# Patient Record
Sex: Male | Born: 1980 | Race: Black or African American | Hispanic: No | Marital: Single | Smoking: Never smoker
Health system: Southern US, Community
[De-identification: ages and names within clinical notes are randomized; demographics above are authoritative.]

## PROBLEM LIST (undated history)

## (undated) DIAGNOSIS — H9190 Unspecified hearing loss, unspecified ear: Secondary | ICD-10-CM

## (undated) DIAGNOSIS — Q670 Congenital facial asymmetry: Secondary | ICD-10-CM

## (undated) DIAGNOSIS — T7840XA Allergy, unspecified, initial encounter: Secondary | ICD-10-CM

## (undated) DIAGNOSIS — J330 Polyp of nasal cavity: Secondary | ICD-10-CM

## (undated) HISTORY — DX: Allergy, unspecified, initial encounter: T78.40XA

## (undated) HISTORY — PX: PALATE SURGERY: SHX729

## (undated) HISTORY — PX: GASTROSTOMY: SHX151

## (undated) HISTORY — PX: OTHER SURGICAL HISTORY: SHX169

## (undated) HISTORY — PX: EXTERNAL EAR SURGERY: SHX627

## (undated) HISTORY — DX: Congenital facial asymmetry: Q67.0

## (undated) HISTORY — DX: Unspecified hearing loss, unspecified ear: H91.90

## (undated) HISTORY — DX: Polyp of nasal cavity: J33.0

---

## 2003-03-25 ENCOUNTER — Encounter: Admission: RE | Admit: 2003-03-25 | Discharge: 2003-03-25 | Payer: Self-pay | Admitting: Family Medicine

## 2004-07-18 ENCOUNTER — Ambulatory Visit: Payer: Self-pay | Admitting: Family Medicine

## 2005-06-02 ENCOUNTER — Ambulatory Visit: Payer: Self-pay | Admitting: Family Medicine

## 2005-12-18 ENCOUNTER — Ambulatory Visit: Payer: Self-pay | Admitting: Sports Medicine

## 2006-01-01 ENCOUNTER — Ambulatory Visit: Payer: Self-pay | Admitting: Family Medicine

## 2006-09-27 DIAGNOSIS — H9190 Unspecified hearing loss, unspecified ear: Secondary | ICD-10-CM | POA: Insufficient documentation

## 2007-01-21 ENCOUNTER — Telehealth: Payer: Self-pay | Admitting: *Deleted

## 2007-01-21 ENCOUNTER — Encounter (INDEPENDENT_AMBULATORY_CARE_PROVIDER_SITE_OTHER): Payer: Self-pay | Admitting: Family Medicine

## 2007-01-21 ENCOUNTER — Ambulatory Visit: Payer: Self-pay | Admitting: Family Medicine

## 2007-01-21 DIAGNOSIS — H66009 Acute suppurative otitis media without spontaneous rupture of ear drum, unspecified ear: Secondary | ICD-10-CM | POA: Insufficient documentation

## 2007-02-26 ENCOUNTER — Encounter (INDEPENDENT_AMBULATORY_CARE_PROVIDER_SITE_OTHER): Payer: Self-pay | Admitting: *Deleted

## 2010-03-22 ENCOUNTER — Ambulatory Visit: Payer: Self-pay | Admitting: Family Medicine

## 2010-03-22 ENCOUNTER — Encounter: Payer: Self-pay | Admitting: Family Medicine

## 2010-03-22 DIAGNOSIS — R112 Nausea with vomiting, unspecified: Secondary | ICD-10-CM | POA: Insufficient documentation

## 2010-03-22 DIAGNOSIS — H0019 Chalazion unspecified eye, unspecified eyelid: Secondary | ICD-10-CM | POA: Insufficient documentation

## 2010-03-22 LAB — CONVERTED CEMR LAB
BUN: 10 mg/dL (ref 6–23)
Calcium: 8.7 mg/dL (ref 8.4–10.5)
Chloride: 101 meq/L (ref 96–112)
Creatinine, Ser: 0.93 mg/dL (ref 0.40–1.50)

## 2010-03-23 ENCOUNTER — Telehealth: Payer: Self-pay | Admitting: *Deleted

## 2010-05-12 ENCOUNTER — Ambulatory Visit: Payer: Self-pay | Admitting: Family Medicine

## 2010-05-12 DIAGNOSIS — H7292 Unspecified perforation of tympanic membrane, left ear: Secondary | ICD-10-CM | POA: Insufficient documentation

## 2010-07-31 HISTORY — PX: TYMPANOSTOMY: SHX2586

## 2010-08-11 ENCOUNTER — Encounter: Payer: Self-pay | Admitting: Family Medicine

## 2010-08-12 ENCOUNTER — Encounter: Payer: Self-pay | Admitting: Family Medicine

## 2010-08-30 NOTE — Assessment & Plan Note (Signed)
Summary: ear ache/virus,df   Vital Signs:  Patient profile:   30 year old male Height:      66.5 inches Weight:      144 pounds BMI:     22.98 Temp:     98.0 degrees F oral Pulse rate:   74 / minute BP sitting:   101 / 69  (left arm) Cuff size:   regular  Vitals Entered By: Tessie Fass CMA (March 22, 2010 2:36 PM) CC: right ear drainage x 4 days Pain Assessment Patient in pain? no        CC:  right ear drainage x 4 days.  History of Present Illness: Here for right ear pain, bumps on eye lids.  Right ear has been draining.  Had episode of vomiting and diarrhea 2 days ago that he and Mother beleive may have been food poisioning.  He is no longer vomiting. He does no drink a lot of fluids, denies dysphagia.  History of ear infections and tympanoplasty.  Born with cleft palate and repair at age 60.    Current Medications (verified): 1)  Amoxicillin 250 Mg/13ml Susr (Amoxicillin) .... 2 Teaspoonsful Three Times A Day For 10 Days, Qs  Allergies (verified): No Known Drug Allergies  Review of Systems General:  Denies fever and sweats. ENT:  Complains of ear discharge and earache; denies difficulty swallowing, nasal congestion, sinus pressure, and sore throat. Resp:  Denies cough. GI:  resolved diarrhea and vomiting. GU:  Denies dysuria.  Physical Exam  General:  Alert, obvious facial deformities. Eyes:  Bilateral chalzions on upper lids Ears:  Left ear with small morphology, right ear externally more normal in appearance.  Left TM with red drum, pus in the canal, and perforation of the TM. Nose:  inflammed Mouth:  small pharynx, no uvula, dry membranes. Lungs:  normal respiratory effort and normal breath sounds.   Heart:  normal rate and regular rhythm.     Impression & Recommendations:  Problem # 1:  OM, ACUTE SUPPURATIVE NOS (ICD-382.00) Mother requested suspension, he cannot swallow pills His updated medication list for this problem includes:    Amoxicillin 250  Mg/1ml Susr (Amoxicillin) .Marland Kitchen... 2 teaspoonsful three times a day for 10 days, qs  Orders: [facility]- Est  Level 4 ([address])  Problem # 2:  CHALAZION (ICD-373.2) Bilateral, gave written handout, hot compresses, if no improvement in 1-2 months can refer for incision. Orders: [facility]- Est  Level 4 ([address])  Problem # 3:  NAUSEA WITH VOMITING (ICD-787.01) resolved now, check lytes for dehydration and electrolye imbalance Orders: [facility] ([address]11-91478) [facility]- Est  Level 4 ([address])  Complete Medication List: 1)  Amoxicillin 250 Mg/38ml Susr (Amoxicillin) .... 2 teaspoonsful three times a day for 10 days, qs  Patient Instructions: 1)  Hot compresses to eye lids twice daily for one month, if bumps are still there call and we will send to eye doctor 2)  Take a full 7 days of the antibiotic, make sure you take 2 teaspoonsful with each dose 3)  Drink a lot of fluids this week Prescriptions: AMOXICILLIN 250 MG/5ML SUSR (AMOXICILLIN) 2 teaspoonsful three times a day for 10 days, QS  #1 x 0   Entered and Authorized by:   Luretha Murphy NP   Signed by:   Luretha Murphy NP on 03/23/2010   Method used:   [facility]        [facility] [address] [phone]* (retail)       [address]  [place], [place]  [address]       Ph: [phone] or [phone]       Fax: [phone]   RxID:   [id number]

## 2010-08-30 NOTE — Progress Notes (Signed)
----   Converted from flag ---- ---- 03/23/2010 8:33 AM, Luretha Murphy NP wrote: please call to tell then the blood work was normal, Tx ------------------------------  called pt notified blood work was normal.

## 2010-08-30 NOTE — Assessment & Plan Note (Signed)
Summary: RT EAR DRAINAGE OF BLOOD/[facility]   Vital Signs:  Patient profile:   30 year old male Height:      66.5 inches Weight:      144.31 pounds BMI:     23.03 BSA:     1.75 Temp:     98.4 degrees F Pulse rate:   76 / minute BP sitting:   126 / 83  Vitals Entered By: Jone Baseman CMA (May 12, 2010 3:25 PM) CC: right ear drainage Is Patient Diabetic? No Pain Assessment Patient in pain? no        Primary Care Provider:  . [facility]  CC:  right ear drainage.  History of Present Illness: 30 yo male Here for right ear pain, bumps on eye lids.  Right ear has been draining blood but stopped today.  Drained for 2 days after he was using a q tip.  Pt has tube in each ear no pus denies fever.  No nausea or voming pt already haas trouble with hearing.   History of ear infections and tympanoplasty.  Born with cleft palate and repair at age 3.    eye bumps bilateral mildly tender, has had them before seems to come and go does not cause any vision problems. Pt just doesn't like  how they look.   Habits & Providers  Alcohol-Tobacco-Diet     Tobacco Status: never  Current Medications (verified): 1)  Amoxicillin 250 Mg/69ml Susr (Amoxicillin) .... 2 Teaspoonsful Three Times A Day For 10 Days, Qs 2)  Ear Drops Earwax Aid 6.5 % Soln (Carbamide Peroxide) .Marland Kitchen.. 1 Drop Each Ear At Night  Allergies (verified): No Known Drug Allergies  Past History:  Past Medical History: Cleft Palate repair at 30 years of age, nasal polyps larger on Right, recurent AOM with tympanostomy tubes, S/P Gastrostomy Tube at 30 yo-? Etiology, now fine.  Pt has some congenital deformity but not stated in record.  PMH-FH-SH reviewed-no changes except otherwise noted  Review of Systems       denies fever, chills, nausea, vomiting, diarrhea or constipation   Physical Exam  General:  Alert, obvious facial deformities. Eyes:  Bilateral chalzions on upper lids, no discharge, mild tender, no  erythema Ears:  Left ear with small morphology, right ear externally more normal in appearance.  Right ear has clot over well healing hole in TM, no discharge, no signs of infxn.  Mouth:  small pharynx, no uvula, dry membranes. Lungs:  normal respiratory effort and normal breath sounds.   Heart:  normal rate and regular rhythm.     Impression & Recommendations:  Problem # 1:  TYMPANIC MEMBRANE PERFORATION (ICD-384.20) pt did perferat it with Q tips.  Told pt not to use Q tips, gave pt some loops to use and gave ear drops to help break down ear wax (able to use with tubes).  Pt given red flags to look for.  father accompanied pt and understood the information as well.  Orders: [facility]- Est Level  3 ([address])  Problem # 2:  CHALAZION (ICD-373.2) warm compresses to each eye for 10 minutes at  a time twice a day.  Orders: [facility]- Est Level  3 ([address])  Complete Medication List: 1)  Amoxicillin 250 Mg/25ml Susr (Amoxicillin) .... 2 teaspoonsful three times a day for 10 days, qs 2)  Ear Drops Earwax Aid 6.5 % Soln (Carbamide peroxide) .Marland Kitchen.. 1 drop each ear at night  Patient Instructions: 1)  Nice to meet you 2)  NO MORE  QTIPS!!!!!!!!! 3)  Use the loops I gave you 4)  I am sending you in some ear drops.  Use them 1 drop each ear at night.  5)  If you get a fever or start having pus out your ear come back 6)  For your eyes I want you to put a warm wet washcloth over your ear for ten minutes 2 times a day.  7)  I want to see you again for a full physical at some point.   Prescriptions: EAR DROPS EARWAX AID 6.5 % SOLN (CARBAMIDE PEROXIDE) 1 drop each ear at night  #1 x 3   Entered and Authorized by:   Antoine Primas DO   Signed by:   Antoine Primas DO on 05/12/2010   Method used:   [facility] to        [facility] [address] [phone]* (retail)       [address]       [place], [place]  [address]       Ph: [phone] or [phone]       Fax: [phone]   RxID:    [id number]

## 2010-09-01 NOTE — Miscellaneous (Signed)
Summary: ROI: DDS  ROI: DDS   Imported By: Knox Royalty 08/25/2010 08:52:01  _____________________________________________________________________  External Attachment:    Type:   Image     Comment:   External Document

## 2010-09-01 NOTE — Miscellaneous (Signed)
Summary: ROI- Disability  ROI- Disability   Imported By: De Nurse 08/15/2010 13:41:11  _____________________________________________________________________  External Attachment:    Type:   Image     Comment:   External Document

## 2010-10-24 ENCOUNTER — Ambulatory Visit (INDEPENDENT_AMBULATORY_CARE_PROVIDER_SITE_OTHER): Payer: Medicare Other | Admitting: Family Medicine

## 2010-10-24 ENCOUNTER — Encounter: Payer: Self-pay | Admitting: Family Medicine

## 2010-10-24 DIAGNOSIS — J309 Allergic rhinitis, unspecified: Secondary | ICD-10-CM

## 2010-10-24 DIAGNOSIS — Z0000 Encounter for general adult medical examination without abnormal findings: Secondary | ICD-10-CM

## 2010-10-24 DIAGNOSIS — J302 Other seasonal allergic rhinitis: Secondary | ICD-10-CM | POA: Insufficient documentation

## 2010-10-24 MED ORDER — MONTELUKAST SODIUM 5 MG PO CHEW: 5.0000 mg | CHEWABLE_TABLET | Freq: Every evening | ORAL | Status: DC

## 2010-10-24 MED ORDER — CETIRIZINE HCL 10 MG PO TABS: 10.0000 mg | ORAL_TABLET | Freq: Every day | ORAL | Status: DC

## 2010-10-24 NOTE — Patient Instructions (Signed)
It was a pleasure to care for you today.  Please take medication as prescribed for allergies.   Please return for fasting labs and schedule a follow up appointment to establish care within the next month.

## 2010-10-24 NOTE — Progress Notes (Signed)
Subjective:     Charles Fletcher is a 30 y.o. male and is here for a comprehensive physical exam. The patient reports problems - seasonal allergies with rhinorrhea without fever, watery eyes or any other accompanying symptoms. he has only tried benadryl for relief. .  History   Social History  . Marital Status: Single    Spouse Name: N/A    Number of Children: N/A  . Years of Education: N/A   Occupational History  . Not on file.   Social History Main Topics  . Smoking status: Never Smoker   . Smokeless tobacco: Not on file  . Alcohol Use: Not on file  . Drug Use: Not on file  . Sexually Active: Not on file   Other Topics Concern  . Not on file   Social History Narrative  . No narrative on file   Health Maintenance  Topic Date Due  . Tetanus/tdap  09/02/2000    The following portions of the patient's history were reviewed and updated as appropriate: allergies, current medications, past family history, past medical history, past social history, past surgical history and problem list.  Review of Systems Pertinent items are noted in HPI.   Objective:    BP 110/79  Pulse 67  Temp(Src) 96.9 F (36.1 C) (Oral)  Ht 5' 6.6" (1.692 m)  Wt 142 lb (64.411 kg)  BMI 22.51 kg/m2 General appearance: alert, cooperative and appears stated age Head: Normocephalic, without obvious abnormality, atraumatic Eyes: conjunctivae/corneas clear. PERRL, EOM's intact. Fundi benign. Ears: normal TM's and external ear canals both ears and small ears with abnormal scarring of the TM bilaterally Nose: clear discharge, ? Nasal polyp right Throat: lips, mucosa, and tongue normal; teeth and gums normal Neck: no adenopathy, no carotid bruit, no JVD, supple, symmetrical, trachea midline and thyroid not enlarged, symmetric, no tenderness/mass/nodules Lungs: clear to auscultation bilaterally Heart: regular rate and rhythm, S1, S2 normal, no murmur, click, rub or gallop Abdomen: soft, non-tender; bowel  sounds normal; no masses,  no organomegaly Extremities: extremities normal, atraumatic, no cyanosis or edema Skin: Skin color, texture, turgor normal. No rashes or lesions    Assessment:    Healthy male exam.  Allergic rhinitis     Plan:    1. singulair for allergies 2. Fasting labs for well adult care.  3. Return to clinic in one month to establish care. See After Visit Summary for Counseling Recommendations

## 2010-10-24 NOTE — Progress Notes (Signed)
Addended by: Maryelizabeth Kaufmann on: 10/24/2010 05:09 PM   Modules accepted: Level of Service

## 2010-11-29 ENCOUNTER — Ambulatory Visit (INDEPENDENT_AMBULATORY_CARE_PROVIDER_SITE_OTHER): Payer: Medicare Other | Admitting: Family Medicine

## 2010-11-29 ENCOUNTER — Encounter: Payer: Self-pay | Admitting: Family Medicine

## 2010-11-29 DIAGNOSIS — H9210 Otorrhea, unspecified ear: Secondary | ICD-10-CM

## 2010-11-29 DIAGNOSIS — Z4589 Encounter for adjustment and management of other implanted devices: Secondary | ICD-10-CM

## 2010-11-29 DIAGNOSIS — H9220 Otorrhagia, unspecified ear: Secondary | ICD-10-CM

## 2010-11-29 DIAGNOSIS — H9222 Otorrhagia, left ear: Secondary | ICD-10-CM | POA: Insufficient documentation

## 2010-11-29 DIAGNOSIS — Z09 Encounter for follow-up examination after completed treatment for conditions other than malignant neoplasm: Secondary | ICD-10-CM

## 2010-11-29 NOTE — Assessment & Plan Note (Signed)
Likely from chronic irritation of the ear canal from placing foreign objects in there.  No definite signs of TM perforation, appears to just involve the external ear canal.  No signs of infection.  Will send to ENT for a tube check but also for more extensive ear canal exam.

## 2010-11-29 NOTE — Assessment & Plan Note (Signed)
According to mom it has been in there for >20 years.  Will send to ENT for a tube check.

## 2010-11-29 NOTE — Progress Notes (Signed)
  Subjective:    Patient ID: Charles Fletcher, male    DOB: 02/22/1981, 30 y.o.   MRN: [id number]  HPI 1. Blood from ears:  Pt with a hx of bleeding from his ears last year.  That was thought to be related to the aggressive use of Q-tips and perforation of the ear drums.  Since then he has stopped using q-tips but is still putting other things in his ears to help clean them out.  He noticed a little bit of blood in his left ear last week and then yesterday also noticed a little bit of blood from his right ear.  He is not having any pain in either of his ears.  He has a TM tube in his left ears since he was a child and they think that it is still in there.     Review of Systems Denies pus from the ears, denies headaches, sinus pain / pressure.  Denies changes in his hearing.    Objective:   Physical Exam  Constitutional: No distress.       Obvious facial deformities  HENT:       Small shaped ears Left ear: external ear canal is cracked with some bright red blood.  TM not fully visualized due to wax and scabbing.  Appears intact.  Tube not seen. Right ear:  External ear canal is cracked with some bright red blood.  TM not fully visualized due to wax and scabbing.  Appears intact.          Assessment & Plan:

## 2010-11-29 NOTE — Patient Instructions (Signed)
I think that the bleeding is coming from irritation of the ear canal Please stop putting anything into the ear canal When the bleeding stops you can start using Debrox to help clean out the ear wax We will also get you set up to see an ENT doctor to check on his tube

## 2011-01-31 ENCOUNTER — Ambulatory Visit (INDEPENDENT_AMBULATORY_CARE_PROVIDER_SITE_OTHER): Payer: Medicare Other | Admitting: Family Medicine

## 2011-01-31 ENCOUNTER — Encounter: Payer: Self-pay | Admitting: Family Medicine

## 2011-01-31 DIAGNOSIS — Q670 Congenital facial asymmetry: Secondary | ICD-10-CM

## 2011-01-31 DIAGNOSIS — Q278 Other specified congenital malformations of peripheral vascular system: Secondary | ICD-10-CM

## 2011-01-31 DIAGNOSIS — Q674 Other congenital deformities of skull, face and jaw: Secondary | ICD-10-CM

## 2011-01-31 DIAGNOSIS — Z13220 Encounter for screening for lipoid disorders: Secondary | ICD-10-CM

## 2011-01-31 NOTE — Assessment & Plan Note (Signed)
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

## 2011-01-31 NOTE — Assessment & Plan Note (Signed)
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 Without knowing for sure do not know what screening should be done but it should be ok.  NO true symptoms of anything at this time.  Does have family history of cartilage disorders per old charts.

## 2011-01-31 NOTE — Progress Notes (Signed)
  Subjective:    Patient ID: Charles Fletcher, male    DOB: 08-29-1980, 29 y.o.   MRN: [id number]  HPI 30 yo male here for cpe.  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  Does help mom around the house, no true plans for the future. Has been seen before at [facility] for oral facial problems but mom does not remember exactly what for except the palate surgery.     Review of Systems Denies fever, chills, nausea vomiting abdominal pain, dysuria, chest pain, shortness of breath dyspnea on exertion or numbness in extremities Past medical history, social, surgical and family history all reviewed as well as from centricity but no notes prior to 2008.      Objective:   Physical Exam Vitals reviewed    General appearance: alert, cooperative and appears stated age does have significant facial asymmetry with externally rotated ears.  Head: occiput has small pearly white hemangioma no erythema Eyes: conjunctivae/corneas clear. PERRL, EOM's intact. Fundi benign.  Ears: normal TM's and external ear canals smaller ears with abnormal scarring of the TM bilaterally  Nose: clear discharge, + right polyp Throat: lips, mucosa, and tongue normal; teeth and gums normal  Neck: no adenopathy, no carotid bruit, supple, symmetrical, trachea midline and thyroid enlarged but symmetric, no tenderness/mass/nodules  Lungs: clear to auscultation bilaterally  Heart: regular rate and rhythm, S1, S2 normal, no murmur, click, rub or gallop  Abdomen: soft, non-tender; bowel sounds normal; no masses, no organomegaly  Extremities: extremities normal, atraumatic, no cyanosis or edema good tone Skin: Skin color, texture, turgor normal. No rashes or lesions. .      Assessment & Plan:

## 2011-02-01 ENCOUNTER — Encounter: Payer: Self-pay | Admitting: Family Medicine

## 2011-03-06 ENCOUNTER — Encounter (HOSPITAL_COMMUNITY)
Admission: RE | Admit: 2011-03-06 | Discharge: 2011-03-06 | Disposition: A | Discharge status: Home / Self Care | Payer: Medicare Other | Source: Ambulatory Visit | Attending: Oral Surgery | Admitting: Oral Surgery

## 2011-03-06 LAB — CBC
Hemoglobin: 15.8 g/dL (ref 13.0–17.0)
MCH: 28.7 pg (ref 26.0–34.0)
Platelets: 242 10*3/uL (ref 150–400)
RBC: 5.51 MIL/uL (ref 4.22–5.81)

## 2011-03-14 ENCOUNTER — Inpatient Hospital Stay (HOSPITAL_COMMUNITY)
Admission: RE | Admit: 2011-03-14 | Discharge: 2011-03-16 | DRG: 157 | Disposition: A | Discharge status: Home / Self Care | Payer: Medicare Other | Source: Ambulatory Visit | Attending: Oral Surgery | Admitting: Oral Surgery

## 2011-03-14 ENCOUNTER — Ambulatory Visit (HOSPITAL_COMMUNITY): Payer: Medicare Other

## 2011-03-14 DIAGNOSIS — J690 Pneumonitis due to inhalation of food and vomit: Secondary | ICD-10-CM | POA: Diagnosis not present

## 2011-03-14 DIAGNOSIS — J810 Acute pulmonary edema: Secondary | ICD-10-CM

## 2011-03-14 DIAGNOSIS — Z01812 Encounter for preprocedural laboratory examination: Secondary | ICD-10-CM

## 2011-03-14 DIAGNOSIS — J811 Chronic pulmonary edema: Secondary | ICD-10-CM | POA: Diagnosis not present

## 2011-03-14 DIAGNOSIS — R0902 Hypoxemia: Secondary | ICD-10-CM | POA: Diagnosis not present

## 2011-03-14 DIAGNOSIS — Z884 Allergy status to anesthetic agent status: Secondary | ICD-10-CM

## 2011-03-14 DIAGNOSIS — J9600 Acute respiratory failure, unspecified whether with hypoxia or hypercapnia: Secondary | ICD-10-CM | POA: Diagnosis not present

## 2011-03-14 DIAGNOSIS — K006 Disturbances in tooth eruption: Principal | ICD-10-CM | POA: Diagnosis present

## 2011-03-14 DIAGNOSIS — F79 Unspecified intellectual disabilities: Secondary | ICD-10-CM | POA: Diagnosis present

## 2011-03-14 DIAGNOSIS — Y9210 Unspecified residential institution as the place of occurrence of the external cause: Secondary | ICD-10-CM | POA: Diagnosis not present

## 2011-03-14 DIAGNOSIS — Y838 Other surgical procedures as the cause of abnormal reaction of the patient, or of later complication, without mention of misadventure at the time of the procedure: Secondary | ICD-10-CM | POA: Diagnosis not present

## 2011-03-14 LAB — PRO B NATRIURETIC PEPTIDE: Pro B Natriuretic peptide (BNP): 37 pg/mL (ref 0–125)

## 2011-03-14 LAB — GLUCOSE, CAPILLARY: Glucose-Capillary: 113 mg/dL — ABNORMAL HIGH (ref 70–99)

## 2011-03-14 LAB — URINALYSIS, ROUTINE W REFLEX MICROSCOPIC
Bilirubin Urine: NEGATIVE
Ketones, ur: NEGATIVE mg/dL
Nitrite: NEGATIVE
Urobilinogen, UA: 0.2 mg/dL (ref 0.0–1.0)

## 2011-03-14 LAB — DIFFERENTIAL
Basophils Relative: 0 % (ref 0–1)
Eosinophils Absolute: 0 10*3/uL (ref 0.0–0.7)
Monocytes Absolute: 1 10*3/uL (ref 0.1–1.0)
Monocytes Relative: 7 % (ref 3–12)
Neutrophils Relative %: 91 % — ABNORMAL HIGH (ref 43–77)

## 2011-03-14 LAB — URINE MICROSCOPIC-ADD ON

## 2011-03-14 LAB — CARDIAC PANEL(CRET KIN+CKTOT+MB+TROPI)
CK, MB: 2.9 ng/mL (ref 0.3–4.0)
Relative Index: 0.4 (ref 0.0–2.5)
Total CK: 682 U/L — ABNORMAL HIGH (ref 7–232)

## 2011-03-14 LAB — CBC
MCH: 29.8 pg (ref 26.0–34.0)
MCHC: 35.1 g/dL (ref 30.0–36.0)
Platelets: 184 10*3/uL (ref 150–400)
RBC: 5.84 MIL/uL — ABNORMAL HIGH (ref 4.22–5.81)

## 2011-03-14 LAB — BASIC METABOLIC PANEL
Calcium: 9.2 mg/dL (ref 8.4–10.5)
Creatinine, Ser: 0.88 mg/dL (ref 0.50–1.35)
GFR calc Af Amer: >60 mL/min (ref >60)

## 2011-03-14 LAB — MAGNESIUM: Magnesium: 1.8 mg/dL (ref 1.5–2.5)

## 2011-03-15 ENCOUNTER — Inpatient Hospital Stay (HOSPITAL_COMMUNITY): Payer: Medicare Other

## 2011-03-15 LAB — URINE CULTURE
Colony Count: NO GROWTH
Culture  Setup Time: 201208141955
Culture: NO GROWTH
Special Requests: NEGATIVE

## 2011-03-15 LAB — LEGIONELLA ANTIGEN, URINE: Legionella Antigen, Urine: NEGATIVE

## 2011-03-15 LAB — CBC
MCV: 84.4 fL (ref 78.0–100.0)
Platelets: 197 10*3/uL (ref 150–400)
RDW: 13 % (ref 11.5–15.5)
WBC: 20.6 10*3/uL — ABNORMAL HIGH (ref 4.0–10.5)

## 2011-03-15 LAB — BASIC METABOLIC PANEL
BUN: 12 mg/dL (ref 6–23)
CO2: 30 mEq/L (ref 19–32)
Calcium: 9.1 mg/dL (ref 8.4–10.5)
Glucose, Bld: 140 mg/dL — ABNORMAL HIGH (ref 70–99)

## 2011-03-15 LAB — CARDIAC PANEL(CRET KIN+CKTOT+MB+TROPI)
CK, MB: 2.6 ng/mL (ref 0.3–4.0)
Relative Index: 0.4 (ref 0.0–2.5)
Total CK: 698 U/L — ABNORMAL HIGH (ref 7–232)

## 2011-03-16 DIAGNOSIS — J9620 Acute and chronic respiratory failure, unspecified whether with hypoxia or hypercapnia: Secondary | ICD-10-CM

## 2011-03-16 DIAGNOSIS — R0902 Hypoxemia: Secondary | ICD-10-CM

## 2011-03-16 LAB — CBC
MCH: 28.3 pg (ref 26.0–34.0)
MCHC: 33.5 g/dL (ref 30.0–36.0)
Platelets: 222 10*3/uL (ref 150–400)
RDW: 13.1 % (ref 11.5–15.5)

## 2011-03-16 NOTE — Op Note (Signed)
Charles Fletcher, STARNES NO.:  [id number]  MEDICAL RECORD NO.:  [id number]  LOCATION:  2102                         FACILITY:  [facility]  PHYSICIAN:  Georgia Lopes, M.D.  DATE OF BIRTH:  1981-05-10  DATE OF PROCEDURE:  03/14/2011 DATE OF DISCHARGE:                              OPERATIVE REPORT   PREOPERATIVE DIAGNOSIS:  Impacted and nonrestorable teeth #1, 16, 17, 18.  POSTOPERATIVE DIAGNOSIS:  Impacted and nonrestorable teeth #1, 16, 17, 18.  PROCEDURE:  Extraction of teeth # 1, 16, 17, 18.  SURGEON:  Georgia Lopes, MD  ANESTHESIA:  General, Dr. Krista Blue attending oral intubation.  ASSISTANTS:  Nixon and Cimler.  INDICATIONS FOR PROCEDURE:  Charles Fletcher is a very pleasant 30 year old who was referred by his general dentist for removal of painful decayed and nonrestorable impacted wisdom teeth.  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  He has a very steep mandibular plane angle and limited mouth opening with limited access because of the possible airway difficulty secondary to the skeletal malocclusion and bite as well as the potential for malignant hyperthermia, the patient was scheduled for general anesthesia with intubation for maximum protection.  PROCEDURE:  The patient was taken to the operating room, placed on the table in supine position.  General anesthesia was administered via intravenous medications and oral endotracheal tube was placed and marked.  The eyes were protected.  The patient was draped for the procedure.  The time-out was performed.  Posterior pharynx was suctioned with the Yankauer and a throat pack was placed.  A bite block was placed in the right side of the mouth and 2% lidocaine 1:100,000 epinephrine was infiltrated in an  inferior alveolar block on the left and buccal and palatal infiltration in the maxilla.  The bite block was repositioned and anesthesia was infiltrated around tooth #1.  A total of 10 mL was utilized and the bite block was repositioned on the right side of the mouth and a sweetheart retractor was used to retract the tongue and then a 15 blade was used to make a full-thickness incision overlying impacted tooth #17 carrying forward to the medial aspect of tooth #18.  The incision was also carried on to the lingual surfaces of tooth #18.  In the maxilla, the 15 blade was used make a full-thickness incision around tooth #16.  The periosteum was then reflected over all of these teeth and bone was removed around tooth #17 and 18 with a striker handpiece under irrigation.  Tooth #17 was sectioned in multiple pieces as was tooth #18 and then the teeth were removed with the 301 elevator and the rongeurs.  Tooth #16 was removed using a 301 elevator and a dental forceps #150.  The sockets on the left side were then curetted.  There was some mild to brisk hemorrhage from the proximal most aspect of the extraction site of tooth #17.  Gelfoam sponge x2 was placed in the socket  and the socket was sutured primarily with 3-0 chromic.  The upper #16 area was also sutured with chromic.  Then the endotracheal tube was were repositioned to the left side of the mouth and the bite block was placed there as well as the sweetheart retractor and a #15 blade was used to make a full-thickness incision around tooth #1.  The periosteum was reflected.  The tooth was elevated with 301 elevator and the tooth was removed with a number 150 forceps.  The socket was curetted, irrigated and closed with 3-0 chromic.  Then the oral cavity was irrigated copiously, found to be with good closure and hemostasis.  The oral cavity was suctioned and the throat pack was removed.  The patient was awakened, taken to the recovery room  breathing spontaneously in good condition having been extubated in the operating room.  ESTIMATED BLOOD LOSS:  Minimum.  COMPLICATIONS:  None.  SPECIMENS:  None.  The patient was scheduled for discharge later that day with routine postoperative instructions given.  He received a prescription for Percocet 5/325 #30 one to two every 4-6 hours p.r.n. pain and was scheduled for a postop visit in the office in 2 weeks.     Georgia Lopes, M.D.     SMJ/MEDQ  D:  03/14/2011  T:  03/14/2011  Job:  [id number]  Electronically Signed by Ocie Doyne M.D. on 03/16/2011 12:48:38 PM

## 2011-03-20 LAB — CULTURE, BLOOD (ROUTINE X 2)
Culture  Setup Time: 201208142350
Culture: NO GROWTH

## 2011-04-13 ENCOUNTER — Ambulatory Visit (INDEPENDENT_AMBULATORY_CARE_PROVIDER_SITE_OTHER)
Admission: RE | Admit: 2011-04-13 | Discharge: 2011-04-13 | Disposition: A | Discharge status: Home / Self Care | Payer: Medicare Other | Source: Ambulatory Visit | Attending: Adult Health | Admitting: Adult Health

## 2011-04-13 ENCOUNTER — Encounter: Payer: Self-pay | Admitting: Adult Health

## 2011-04-13 ENCOUNTER — Ambulatory Visit (INDEPENDENT_AMBULATORY_CARE_PROVIDER_SITE_OTHER): Payer: Medicare Other | Admitting: Adult Health

## 2011-04-13 VITALS — BP 120/60 | HR 83 | Temp 97.0°F | Ht 71.0 in | Wt 147.4 lb

## 2011-04-13 DIAGNOSIS — R918 Other nonspecific abnormal finding of lung field: Secondary | ICD-10-CM

## 2011-04-13 DIAGNOSIS — J95821 Acute postprocedural respiratory failure: Secondary | ICD-10-CM

## 2011-04-13 DIAGNOSIS — R9389 Abnormal findings on diagnostic imaging of other specified body structures: Secondary | ICD-10-CM

## 2011-04-13 DIAGNOSIS — J9589 Other postprocedural complications and disorders of respiratory system, not elsewhere classified: Secondary | ICD-10-CM

## 2011-04-13 NOTE — Patient Instructions (Signed)
You are doing a great job, keep up the good work.  Follow up with our office on an as needed basis Have a wonderful day, we are so glad you are better.

## 2011-04-17 DIAGNOSIS — J95821 Acute postprocedural respiratory failure: Secondary | ICD-10-CM | POA: Insufficient documentation

## 2011-04-17 NOTE — Progress Notes (Signed)
  Subjective:    Patient ID: Charles Fletcher, male    DOB: 06-25-1981, 30 y.o.   MRN: [id number]  HPI 30 yo male with known hx of Mental retardation admitted 03/06/11 for tooth extraction with unfortunate post op hypoxia. 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 During surgery only versed , profolol, and lidocaine were used. CCM was asked to admit.   04/13/11 Post Hospital  Pt returns today for post hospital follow up . Since discharge he says he is doing very well. Is accompanied by family/caregiver. CCM was asked to admit pt to ICU after post op hypoxia . Pt underwent tooth extraction on 8/6. CXR showed probable pulmonary edema. Pt responded well to diuresis. CXR prior to discharge showed Partial clearing of bilateral air space opacities most consistent with edema. . Of note their was not discharge summary at time of visit today.  CXR today showed clear lungs w/ no edema.  Denies dyspnea or cough . No edema.     Review of Systems Constitutional:   No  weight loss, night sweats,  Fevers, chills, fatigue, or  lassitude.  HEENT:   No headaches,  Difficulty swallowing,  Tooth/dental problems, or  Sore throat,                No sneezing, itching, ear ache, nasal congestion, post nasal drip,   CV:  No chest pain,  Orthopnea, PND, swelling in lower extremities, anasarca, dizziness, palpitations, syncope.   GI  No heartburn, indigestion, abdominal pain, nausea, vomiting, diarrhea, change in bowel habits, loss of appetite, bloody stools.   Resp: No shortness of breath with exertion or at rest.  No excess mucus, no productive cough,  No non-productive cough,  No coughing up of blood.  No change in color of mucus.  No wheezing.  No chest wall deformity  Skin: no rash or lesions.  GU: no dysuria, change in color of urine, no urgency or frequency.  No flank pain, no hematuria   MS:  No joint pain or swelling.  No decreased range of motion.   Psych:  No change in mood or  affect. No depression or anxiety.          Objective:   Physical Exam GEN: A/Ox3; pleasant , NAD  HEENT:  Guys/AT,  EACs-clear, TMs-wnl, NOSE-clear, THROAT-clear, no lesions, no postnasal drip or exudate noted.   NECK:  Supple w/ fair ROM; no JVD; normal carotid impulses w/o bruits; no thyromegaly or nodules palpated; no lymphadenopathy.  RESP  Clear  P & A; w/o, wheezes/ rales/ or rhonchi.no accessory muscle use, no dullness to percussion  CARD:  RRR, no m/r/g  , no peripheral edema, pulses intact, no cyanosis or clubbing.  GI:   Soft & nt; nml bowel sounds; no organomegaly or masses detected.  Musco: Warm bil, no deformities or joint swelling noted.    Skin: Warm, no lesions or rashes         Assessment & Plan:

## 2011-04-17 NOTE — Assessment & Plan Note (Signed)
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 Will need to be very cautious in future if surgery Charles Fletcher is needed.  Today cxr is clear . Pt is improving with normal VSS   will have pt follow up As needed  Basis with pulmonary  Pt and caregiver aware

## 2011-04-20 NOTE — H&P (Addendum)
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  The patient has no other medical issue.  He presents to the hospital as an outpatient for teeth extraction requiring general anesthesia.  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  No gas was used.  The patient was extubated postop, brought out to PACU, and was found to be profoundly hypoxic with satting 80% on 100% nonrebreather.  Pulmonary Critical Care was called to evaluate and admit.  PAST MEDICAL HISTORY:  Other than mental retardation is unknown.  SOCIAL HISTORY:  Negative for lifelong nonsmoker, nondrinker.  No history of drug use.  Unaware where the patient lives at this time.  FAMILY HISTORY:  Unknown.  MEDICATIONS:  Unknown.  ALLERGIES:  Malignant hyperthermia during anesthesia for surgical procedure, exact agent is unknown.  REVIEW OF SYSTEMS:  A 12-point review of systems unobtainable as the patient is unable to speak, to give [place] answers to our questions.  PHYSICAL EXAMINATION:  GENERAL:  A well-appearing male, resting comfortably on exam bed, in no acute distress. VITAL SIGNS:  He is afebrile.  Heart rate of 85 and blood pressure of 129/85, respiratory rate of 28, and saturation of 100% on 100% nonrebreather. HEENT:  Normocephalic, atraumatic.   Pupils equal, round, and reactive to light.  Extraocular movements are intact.  Oral mucosa is with evidence of blood, but no evidence of active bleeding.  Nasal mucosa within normal limits. NECK:  No thyromegaly.  No lymphadenopathy.  No jugular reflux appreciated. HEART:  Regular rate and rhythm.  S1, S2.  No murmurs, rubs, or gallops appreciated. LUNGS:  With diffuse crackles audible lung fields. ABDOMEN:  Soft, nontender, and nondistended.  Positive bowel sounds. EXTREMITIES:  No edema, no tenderness appreciated. NEUROLOGIC:  Grossly intact.  LABORATORY STUDIES:  All reviewed, significant for chest x-ray, I reviewed myself, shows a evidence of diffuse pulmonary edema.  CBC with white blood count 3.8, hemoglobin of 15.8, hematocrit 46.8, and platelet count of 242.  ASSESSMENT AND PLAN:  The patient is a 30 year old male with past medical history significant for mental retardation presenting to [facility] with a chief complaint of severe hypoxia after general anesthesia.  The patient most likely has major pressure pulmonary edema.  The anesthesiologist that was running the case, I spoke with personally and did not relay have any evidence of aspiration, reported the patient was intubated with large  scope very easily with no evidence of aspiration prior to, during, or after the surgical procedure.  Thy also report  that his rest of vital signs have remained stable throughout the procedure, but did have some difficulty with wheezing postextubation.  Other than that, I think  consideration  would be aspiration pneumonitis.  However, the patient explained no evidence of aspiration at this time and antibiotic will not be ordered.  We will admit the patient to step-down unit with active diuresis with 40 mg of IV Lasix already been given and 20 mg IV q.6 for 3 doses will be ordered.  KCl will be replaced parenterally.  __________ level will be ordered and the patient will be admitted to  step-down unit for further observation.  We anticipate the patient will be able to mobilize tomorrow and be discharged if he is able to get down  to room air.     Felipa Evener, MD   ______________________________ Felipa Evener, MD    WJY/MEDQ  D:  03/14/2011  T:  03/15/2011  Job:  [id number]  Electronically Signed by Koren Bound MD on 04/20/2011 02:40:24 PM

## 2011-05-25 NOTE — Discharge Summary (Signed)
NAMETAREN, Charles Fletcher  MEDICAL RECORD NO.:  Fletcher  LOCATION:  3707                         FACILITY:  [facility]  PHYSICIAN:  Nelda Bucks, MD DATE OF BIRTH:  1980-11-14  DATE OF ADMISSION:  03/14/2011 DATE OF DISCHARGE:  03/16/2011                              DISCHARGE SUMMARY   DISCHARGE DIAGNOSES: 1. Acute respiratory failure. 2. Oral surgery.  HISTORY OF PRESENT ILLNESS:  Mr. Fabela is a 30 year old male with history of mental retardation, who presented to [facility] for a scheduled outpatient dental surgery.  He has had multiple tooth extractions under general anesthesia.  The patient was extubated postoperatively and found to be significantly hypoxic, requiring 100% non-rebreather, and Pulmonary Critical Care was called to evaluate and admit the patient.  LABORATORY DATA:  At the time of admission, August 14, CBC; white blood cells 14.9, hemoglobin 17.4, hematocrit 49.6, platelets 184.  Magnesium 1.8.  Basic metabolic panel:  Sodium 137, potassium 4.0, glucose 119, BUN 9, creatinine 0.88.  BNP 37.  Urinalysis was negative.  Urine strep negative.  Cardiac panel, troponin less than 0.30, blood cultures x2, final report no growth.  CBC on August 16, white blood cells 10.5, hemoglobin 15.2, hematocrit 45.2, platelets 222.  RADIOLOGY DATA:  August 14, portable chest x-ray shows diffuse interstitial and air space disease, more prominent on the right, no focal consolidation.  August 15 shows partial clearing of bilateral airspace opacities, some nodularity on the right.  HOSPITAL COURSE BY DISCHARGE DIAGNOSES: 1. Acute respiratory failure in the postop setting post oral surgery     likely secondary to negative-pressure pulmonary edema versus     aspiration.  The patient did not require intubation.  He was given     diuretics and watch closely in the intensive care unit.  He quickly     recovered back to baseline respiratory status.   He was discharged     on August 16 with 5 days of p.o. Augmentin for questionable     aspiration.  However, we thought this was most likely secondary to     negative-pressure pulmonary edema in the setting of general     anesthesia.  The patient did follow up in the Pulmonary office with     a chest x-ray to ensure clearing of right-sided opacity. 2. Oral surgery.  The patient followed up with Oral Surgery for     routine postop care.  DISCHARGE MEDICATIONS:  The patient was discharged on 5 days of p.o. Augmentin.  He was on no home medications prior to admission.  DISCHARGE DIET:  Advance diet as tolerated per [facility].  DISCHARGE ACTIVITY:  No restrictions.  DISCHARGE FOLLOWUP: 1. The patient followed up with Rubye Oaks, nurse practitioner,     [facility], on September 13 at 11:30 with a chest x-ray. 2. The patient is to follow up with Dr. Barbette Merino, Dental Surgery in 2     weeks.  OTHER DISCHARGE INSTRUCTIONS:  Ice the jaws and warm salt water rinse 4- 5 times daily.  DISPOSITION:  The patient has met maximum benefit from his inpatient hospitalization.  He is medically cleared and ready  for discharge home. Acute respiratory failure likely secondary to negative-pressure pulmonary edema is resolved.  Routine follow-up with Dental Surgery. The patient is discharged home.     Dirk Dress, NP   ______________________________ Nelda Bucks, MD    KW/MEDQ  D:  05/03/2011  T:  05/03/2011  Job:  [id number]  Electronically Signed by Danford Bad N.P. on 05/17/2011 08:29:19 PM Electronically Signed by Rory Percy MD on 05/25/2011 02:03:46 PM

## 2012-10-04 IMAGING — CR DG CHEST 1V PORT
1 series · 1 of 1 positions shown · non-contrast
Comparison: None.

CLINICAL DATA: Question aspiration.  Status post oral surgery.  Low
oxygen saturation following extubation.

PORTABLE CHEST - 1 VIEW

[view not recorded]
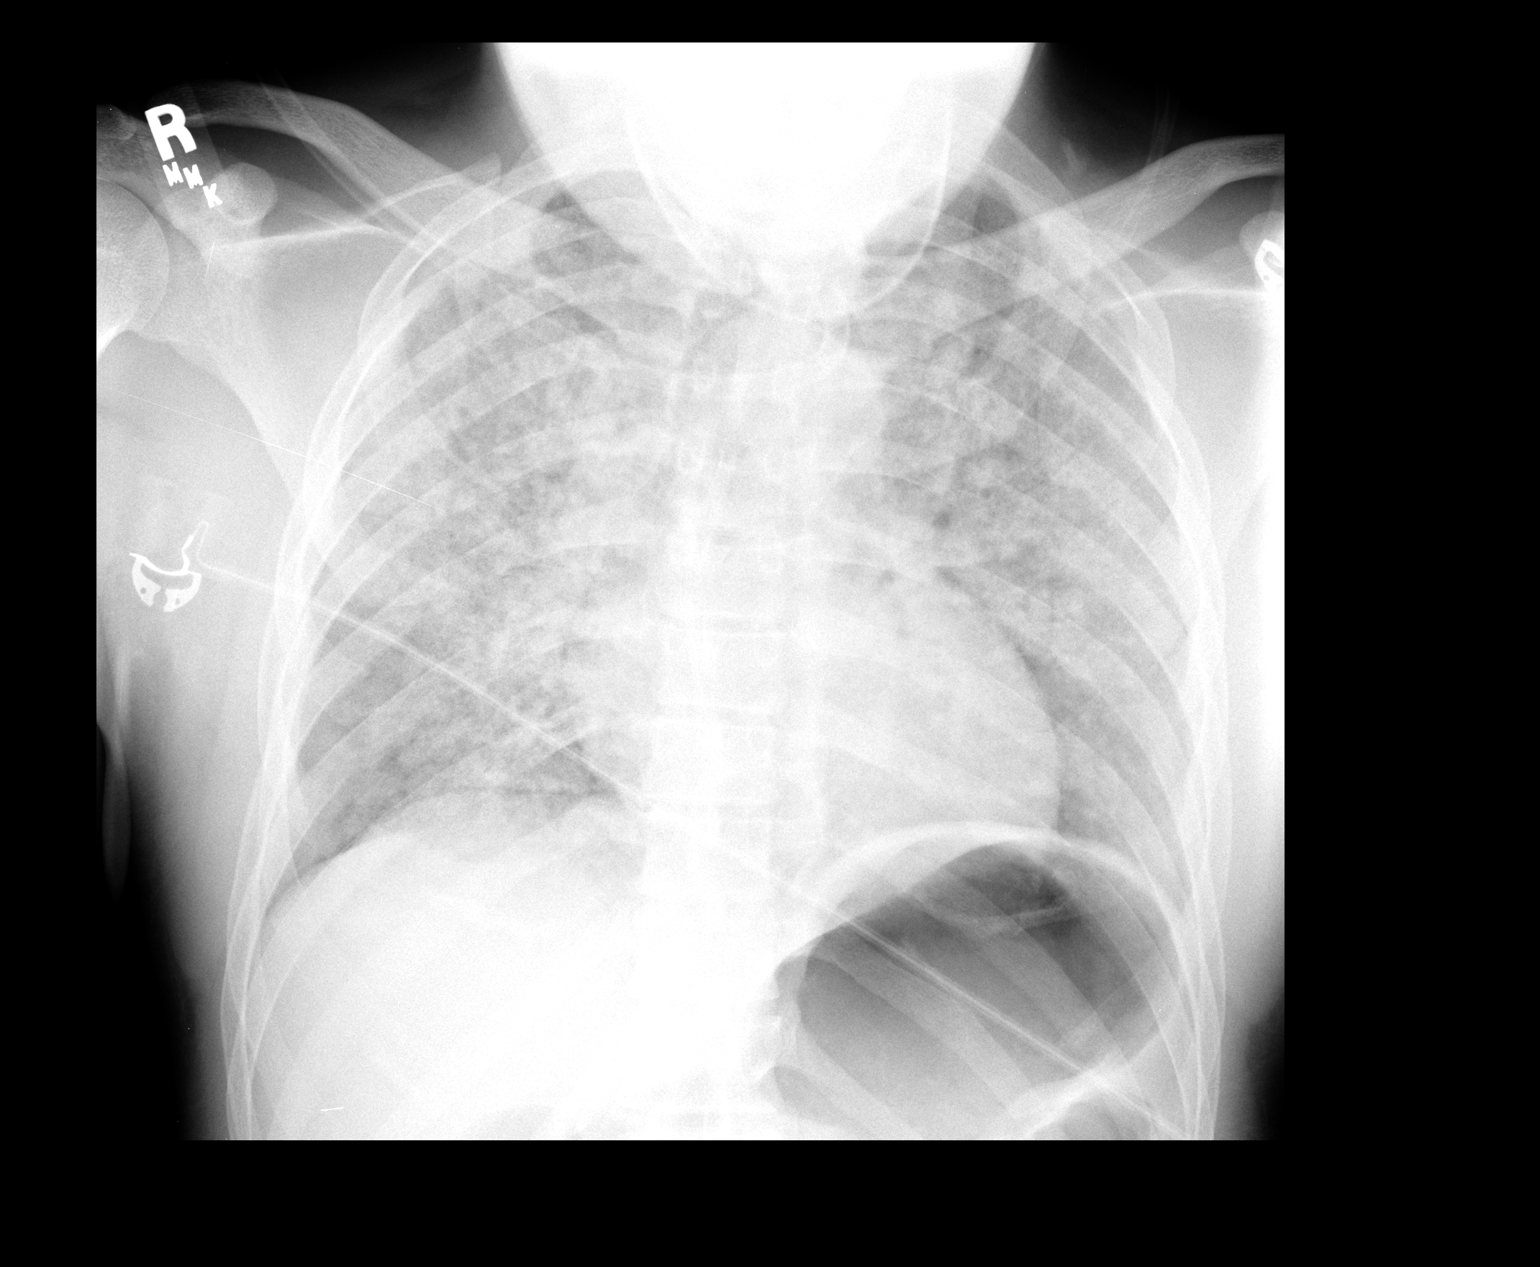

[1 of 1 positions shown; findings below may reference images not displayed]

FINDINGS: A diffuse interstitial and airspace pattern is present
bilaterally.  This is slightly worse on the right.  This most
likely represents edema.  No significant airspace consolidation is
evident.  The lung volumes are low.  The visualized soft tissues
and bony thorax are unremarkable.
IMPRESSION: 1.  Diffuse interstitial and airspace disease is slightly more
prominent on the right.  This likely represents edema.
2.  No focal airspace consolidation.  This is not the typical
appearance of aspiration.

## 2013-04-08 ENCOUNTER — Ambulatory Visit (INDEPENDENT_AMBULATORY_CARE_PROVIDER_SITE_OTHER): Payer: Medicare Other | Admitting: Family Medicine

## 2013-04-08 ENCOUNTER — Encounter: Payer: Self-pay | Admitting: Family Medicine

## 2013-04-08 VITALS — BP 123/78 | HR 73 | Temp 98.4°F | Ht 71.0 in | Wt 165.0 lb

## 2013-04-08 DIAGNOSIS — H9210 Otorrhea, unspecified ear: Secondary | ICD-10-CM

## 2013-04-08 DIAGNOSIS — L708 Other acne: Secondary | ICD-10-CM | POA: Diagnosis not present

## 2013-04-08 DIAGNOSIS — L730 Acne keloid: Secondary | ICD-10-CM | POA: Insufficient documentation

## 2013-04-08 DIAGNOSIS — Z0000 Encounter for general adult medical examination without abnormal findings: Secondary | ICD-10-CM | POA: Diagnosis not present

## 2013-04-08 DIAGNOSIS — H9222 Otorrhagia, left ear: Secondary | ICD-10-CM

## 2013-04-08 MED ORDER — MUPIROCIN 2 % EX OINT: TOPICAL_OINTMENT | Freq: Two times a day (BID) | CUTANEOUS | Status: DC

## 2013-04-08 NOTE — Patient Instructions (Addendum)
Nice to meet you. You are doing great. We are going to refer you to a dermatologist to evaluate the skin on the back of your neck. You must call them with in the next 2 weeks to set up an appointment. This referral is for a keloid. If you continue to have bleeding from your left ear please let us know.

## 2013-04-08 NOTE — Progress Notes (Signed)
Patient ID: Charles Fletcher, male   DOB: 1980-10-02, 32 y.o.   MRN: [id number] Charles Fletcher is a 32 y.o. male who presents today for CPE and to discuss bleeding from his ear and a bump on the back of his head.  Bleeding from ear: left ear. Occurred 2 days ago. Did not have pain with this. States maybe felt full. Occasional congestion and cough. Has history of ear tubes placed a long time ago. Has not been seen by ENT in over 10 years. Has had bleeding from ears previously.  Bump on back of head: has been there "for a while." does not hurt. Occasionally bleeds following getting his hair cut. Does not drain any pus. Has never had this before.   Annual visit: complaints per above, otherwise none. Has never been sexually active. Does not smoke, drink alcohol, or use drugs. Exercises occasionally.   Past Medical History  Diagnosis Date  . Allergy   . hearing loss   . 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 Facial asymmetry     mom and pt decline any workup states they have never been seen by genetics in past.     History  Smoking status  . Never Smoker   Smokeless tobacco  . Not on file    Family History  Problem Relation Age of Onset  . Hyperlipidemia Mother   . Birth defects Brother   . Hearing loss Brother   . Diabetes Maternal Grandmother   . Collagen disease      mother side of family.  Connective tissue diseases.     Review of Symptoms  General:  Negative for nexplained weight loss, fever Skin: Negative for new or changing mole, sore that won't heal HEENT: Positive for trouble hearing. Negative for trouble seeing, ringing in ears, mouth sores, hoarseness, change in voice, dysphagia. CV:  Negative for chest pain, dyspnea, edema, palpitations Resp: Negative for cough, dyspnea, hemoptysis GI: Negative for nausea, vomiting, diarrhea, constipation, abdominal pain, melena, hematochezia. GU: Negative for dysuria, incontinence, urinary hesitance, hematuria, vaginal or penile discharge, polyuria,  sexual difficulty, lumps in testicle or breasts MSK: Negative for muscle cramps or aches, joint pain or swelling Neuro: Negative for headaches, weakness, numbness, dizziness, passing out/fainting Psych: Negative for depression, anxiety, memory problems    Physical Exam Filed Vitals:   04/08/13 1409  BP: 123/78  Pulse: 73  Temp: 98.4 F (36.9 C)    Physical Examination: General appearance - alert, well appearing, and in no distress Eyes - pupils equal and reactive, extraocular eye movements intact, sclera anicteric Ears - bilateral TMs with tubes present, no erythema, blood, or discharge noted Nose - normal and patent, no erythema, discharge or polyps Mouth - mucous membranes moist, pharynx normal without lesions Neck - supple, no significant adenopathy Chest - clear to auscultation, no wheezes, rales or rhonchi, symmetric air entry Heart - normal rate, regular rhythm, normal S1, S2, no murmurs, rubs, clicks or gallops Abdomen - soft, nontender, nondistended, no masses or organomegaly Neurological - alert, oriented, normal speech, no focal findings or movement disorder noted Extremities - no pedal edema noted Skin - on posterior skin of scalp patient with 2 cm keloid noninflammed, scattered areas of keloid folliculitis in the sam area  Assessment/Plan: Please see individual problem list.

## 2013-04-09 NOTE — Assessment & Plan Note (Signed)
Recent episode of bleeding from left ear. Has resolved at this time. Tubes still in place. Suspect this may be related to irritation from foreign objects placed in ear as patient has history of doing this. Will continue to monitor and if recurs on frequent basis will need to consider referring back to ENT.

## 2013-04-09 NOTE — Assessment & Plan Note (Signed)
Doing well. Blood pressure is at goal. Normal BMI. Will have come back for flu and Tdap.

## 2013-04-09 NOTE — Assessment & Plan Note (Signed)
Apparent areas of previous folliculitis now with keloid formation. Do not look infected at this time. Will give bactroban ointment to help prevent infection. Will refer to derm for evaluation of keloid.

## 2013-04-16 ENCOUNTER — Ambulatory Visit (INDEPENDENT_AMBULATORY_CARE_PROVIDER_SITE_OTHER): Payer: Medicare Other | Admitting: *Deleted

## 2013-04-16 DIAGNOSIS — Z23 Encounter for immunization: Secondary | ICD-10-CM | POA: Diagnosis not present

## 2013-04-30 DIAGNOSIS — L738 Other specified follicular disorders: Secondary | ICD-10-CM | POA: Diagnosis not present

## 2013-08-06 DIAGNOSIS — L708 Other acne: Secondary | ICD-10-CM | POA: Diagnosis not present

## 2013-10-23 DIAGNOSIS — L708 Other acne: Secondary | ICD-10-CM | POA: Diagnosis not present

## 2014-10-01 ENCOUNTER — Encounter: Payer: Medicare Other | Admitting: Family Medicine

## 2014-10-06 ENCOUNTER — Encounter: Payer: Self-pay | Admitting: Family Medicine

## 2014-10-06 ENCOUNTER — Ambulatory Visit (INDEPENDENT_AMBULATORY_CARE_PROVIDER_SITE_OTHER): Payer: Medicare Other | Admitting: Family Medicine

## 2014-10-06 ENCOUNTER — Ambulatory Visit (HOSPITAL_COMMUNITY)
Admission: RE | Admit: 2014-10-06 | Discharge: 2014-10-06 | Disposition: A | Discharge status: Home / Self Care | Payer: Medicare Other | Source: Ambulatory Visit | Attending: Family Medicine | Admitting: Family Medicine

## 2014-10-06 VITALS — BP 132/78 | HR 77 | Temp 98.2°F | Ht 68.75 in | Wt 160.3 lb

## 2014-10-06 DIAGNOSIS — R9431 Abnormal electrocardiogram [ECG] [EKG]: Secondary | ICD-10-CM | POA: Diagnosis not present

## 2014-10-06 DIAGNOSIS — R079 Chest pain, unspecified: Secondary | ICD-10-CM | POA: Insufficient documentation

## 2014-10-06 DIAGNOSIS — Z131 Encounter for screening for diabetes mellitus: Secondary | ICD-10-CM

## 2014-10-06 DIAGNOSIS — R030 Elevated blood-pressure reading, without diagnosis of hypertension: Secondary | ICD-10-CM | POA: Diagnosis not present

## 2014-10-06 DIAGNOSIS — IMO0001 Reserved for inherently not codable concepts without codable children: Secondary | ICD-10-CM

## 2014-10-06 LAB — BASIC METABOLIC PANEL
BUN: 9 mg/dL (ref 6–23)
CALCIUM: 8.9 mg/dL (ref 8.4–10.5)
CO2: 30 mEq/L (ref 19–32)
CREATININE: 0.95 mg/dL (ref 0.50–1.35)
Chloride: 103 mEq/L (ref 96–112)
GLUCOSE: 80 mg/dL (ref 70–99)
POTASSIUM: 4.2 meq/L (ref 3.5–5.3)
Sodium: 140 mEq/L (ref 135–145)

## 2014-10-06 LAB — LIPID PANEL
CHOL/HDL RATIO: 3.4 ratio
CHOLESTEROL: 127 mg/dL (ref 0–200)
HDL: 37 mg/dL — AB (ref >=40)
LDL Cholesterol: 68 mg/dL (ref 0–99)
TRIGLYCERIDES: 110 mg/dL (ref <150)
VLDL: 22 mg/dL (ref 0–40)

## 2014-10-06 NOTE — Patient Instructions (Signed)
Nice to see you. We will call with the results of the blood work. We are going to refer you to cardiology. If you do not hear about this in the next week please let us know.   Chest Pain (Nonspecific) It is often hard to give a diagnosis for the cause of chest pain. There is always a chance that your pain could be related to something serious, such as a heart attack or a blood clot in the lungs. You need to follow up with your doctor. HOME CARE  If antibiotic medicine was given, take it as directed by your doctor. Finish the medicine even if you start to feel better.  For the next few days, avoid activities that bring on chest pain. Continue physical activities as told by your doctor.  Do not use any tobacco products. This includes cigarettes, chewing tobacco, and e-cigarettes.  Avoid drinking alcohol.  Only take medicine as told by your doctor.  Follow your doctor's suggestions for more testing if your chest pain does not go away.  Keep all doctor visits you made. GET HELP IF:  Your chest pain does not go away, even after treatment.  You have a rash with blisters on your chest.  You have a fever. GET HELP RIGHT AWAY IF:   You have more pain or pain that spreads to your arm, neck, jaw, back, or belly (abdomen).  You have shortness of breath.  You cough more than usual or cough up blood.  You have very bad back or belly pain.  You feel sick to your stomach (nauseous) or throw up (vomit).  You have very bad weakness.  You pass out (faint).  You have chills. This is an emergency. Do not wait to see if the problems will go away. Call your local emergency services (911 in [place] Do not drive yourself to the hospital. MAKE SURE YOU:   Understand these instructions.  Will watch your condition.  Will get help right away if you are not doing well or get worse. Document Released: 01/03/2008 Document Revised: 07/22/2013 Document Reviewed: 01/03/2008 [facility] Patient  Information 2015 [employer] [place]. This information is not intended to replace advice given to you by your health care provider. Make sure you discuss any questions you have with your health care provider.

## 2014-10-06 NOTE — Progress Notes (Signed)
Patient ID: Charles Fletcher, male   DOB: 02/12/1981, 34 y.o.   MRN: [id number]  Charles AlarEric Nikoloz Huy, MD Phone: [phone]  Charles PihKeith E Wauters is a 34 y.o. male who presents today for f/u.  Mother reports concern for DM. Notes family history on paternal side of this and would like them screened. No polyuria or polydipsia.  Chest pain: for the past 2-3 months notes central sharp chest pain with activity. No radiation. No dyspnea. No diaphoresis. No calf swelling. Does note cramping in calves when sleeps, though not in the past month. No history of blood clot. Notes this occurs when at work doing activities, though not when walking. Notes family history of MI in 65-70 yo's.   Patient is a nonsmoker. Does not drink alcohol or use illicit drugs. No sexual activity.   ROS: Per HPI, otherwise see below. Denies headache, dyspnea, vision changes, abdominal pain, nausea, vomiting, diarrhea, urinary changes, edema.   Physical Exam Filed Vitals:   10/06/14 1430  BP: 132/78  Pulse:   Temp:     Gen: Well NAD HEENT: PERRL,  MMM, normal OP, normal TMs, no thyroid enlargement Lungs: CTABL Nl WOB Heart: RRR No chest wall tenderness.  Abd: soft, NT, ND Exts: Non edematous BL  LE, warm and well perfused. No calf swelling or tenderness.   EKG: NSR, rate 72, early repolarization in V2 and V3, unchanged from previously   Assessment/Plan: Please see individual problem list.  Charles AlarEric Meeyah Ovitt, MD [facility] PGY[facility]

## 2014-10-06 NOTE — Assessment & Plan Note (Signed)
Elevated on first check, normal range on second check. Will check BMET. F/u in one month to ensure this is improved or stable.

## 2014-10-06 NOTE — Assessment & Plan Note (Addendum)
Patient with chest pain with typical (central and some exertional) and atypical (sharp, no radiation, diaphoresis, or dyspnea) features. Concern would be for cardiac cause with acs or pericarditis as the most concerning causes of this pain. No calf swelling, stable vitals, and exertional nature makes PE unlikely. MSK a possibility though there is no tenderness on exam. EKG reassuring. Will refer to cardiology for further evaluation. Check lipid panel and glucose. Given return precautions.   Precepted with Dr Jennette KettleNeal.

## 2014-10-08 ENCOUNTER — Encounter: Payer: Self-pay | Admitting: Family Medicine

## 2014-11-11 NOTE — Progress Notes (Signed)
Cardiology Office Note   Date:  11/11/2014   ID:  Charles PihKeith E Nies, DOB 04/24/1981, MRN [id number]  PCP:  Marikay AlarSonnenberg, Eric, MD  Cardiologist:   Charlton HawsPeter Page Lancon, MD   No chief complaint on file.     History of Present Illness: Charles Fletcher is a 34 y.o. male who presents for evaluation of chest pain.  Chest pain: for the past 2-3 months notes central sharp chest pain with activity. No radiation. No dyspnea. No diaphoresis. No calf swelling. Does note cramping in calves when sleeps, though not in the past month. No history of blood clot. Notes this occurs when at work doing activities, though not when walking. Notes family history of MI in 65-70 yo's. Did have an episode of ? 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 Allergy   . hearing loss   . 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 Facial asymmetry     mom and pt decline any workup states they have never been seen by genetics in past.     Past Surgical History  Procedure Laterality Date  . Palate surgery      34 years of age     Current Outpatient Prescriptions  Medication Sig Dispense Refill  . mupirocin ointment (BACTROBAN) 2 % Apply topically 2 (two) times daily. To the affected areas for the next 10 days 22 g 0   No current facility-administered medications for this visit.    Allergies:   Review of patient's allergies indicates no known allergies.    Social History:  The patient  reports that he has never smoked. He does not have any smokeless tobacco history on file. He reports that he does not drink alcohol or use illicit drugs.   Family History:  The patient's family history includes Birth defects in his brother; Collagen disease in an other family member; Diabetes in his  maternal grandmother; Hearing loss in his brother; Hyperlipidemia in his mother.    ROS:  Please see the history of present illness.   Otherwise, review of systems are positive for none.   All other systems are reviewed and negative.    PHYSICAL EXAM: VS:  There were no vitals taken for this visit. , BMI There is no weight on file to calculate BMI. GEN: Well nourished, well developed, in no acute distress HEENT: normal Neck: no JVD, carotid bruits, or masses Cardiac:  RRR; no murmurs, rubs, or gallops,no edema  Respiratory:  clear to auscultation bilaterally, normal work of breathing GI: soft, nontender, nondistended, + BS MS: no deformity or atrophy Skin: warm and dry, no rash Neuro:  Strength and sensation are intact Psych: euthymic mood, full affect   EKG:   10/06/14  SR rate 80 nonspecific St changes inferior leads.  No change from 2012     Recent Labs: 10/06/2014: BUN 9; Creatinine 0.95; Potassium 4.2; Sodium 140    Lipid Panel    Component Value Date/Time   CHOL 127 10/06/2014 1441   TRIG 110 10/06/2014 1441   HDL 37* 10/06/2014 1441   CHOLHDL 3.4 10/06/2014 1441   VLDL 22 10/06/2014 1441   LDLCALC 68 10/06/2014 1441      Wt Readings from Last 3 Encounters:  10/06/14  160 lb 5 oz (72.717 kg)  04/08/13 165 lb (74.844 kg)  04/13/11 147 lb 6.4 oz (66.86 kg)      Other studies Reviewed: Additional studies/ records that were reviewed today include:  Family Practice records.    ASSESSMENT AND PLAN:  1.  Chest Pain:  Atypical nonspecific inferior T wave changes f/u stress echo    Current medicines are reviewed at length with the patient today.  The patient does not have concerns regarding medicines.  The following changes have been made:  no change  Labs/ tests ordered today include:  Stress echo  No orders of the defined types were placed in this encounter.     Disposition:   FU with  PRN  i    Signed, Charlton Haws, MD  11/11/2014 10:49 AM    [facility] [address] [place], [place], [place]  [address] Phone: [phone]; Fax: [phone]

## 2014-11-12 ENCOUNTER — Encounter: Payer: Self-pay | Admitting: Cardiovascular Disease

## 2014-11-12 ENCOUNTER — Ambulatory Visit (INDEPENDENT_AMBULATORY_CARE_PROVIDER_SITE_OTHER): Payer: Medicare Other | Admitting: Cardiovascular Disease

## 2014-11-12 VITALS — BP 110/70 | HR 88 | Ht 68.0 in | Wt 163.6 lb

## 2014-11-12 DIAGNOSIS — R0789 Other chest pain: Secondary | ICD-10-CM | POA: Diagnosis not present

## 2014-11-12 NOTE — Patient Instructions (Signed)
Medication Instructions:  NO  CHANGES  Labwork: NONE Testing/Procedures: STRESS ECHO  Follow-Up: AS NEEDED  Any Other Special Instructions Will Be Listed Below (If Applicable).

## 2014-11-18 ENCOUNTER — Ambulatory Visit (HOSPITAL_COMMUNITY): Payer: Medicare Other | Attending: Cardiovascular Disease

## 2014-11-18 DIAGNOSIS — R0789 Other chest pain: Secondary | ICD-10-CM | POA: Diagnosis not present

## 2014-11-18 NOTE — Progress Notes (Signed)
Stress echo performed. 

## 2014-11-25 ENCOUNTER — Telehealth: Payer: Self-pay | Admitting: *Deleted

## 2014-11-25 NOTE — Telephone Encounter (Signed)
Normal stress echo         ----- Message -----     From: Yolande JollyKwong H Chung, RDCS     Sent: 11/24/2014  4:01 PM      To: Wendall StadePeter C Nishan, MD            PT' MOTHER  AWARE OF TEST RESULTS./CY

## 2017-02-05 ENCOUNTER — Encounter: Payer: Self-pay | Admitting: Family Medicine

## 2017-02-05 ENCOUNTER — Ambulatory Visit (INDEPENDENT_AMBULATORY_CARE_PROVIDER_SITE_OTHER): Payer: Medicare Other | Admitting: Family Medicine

## 2017-02-05 VITALS — BP 110/68 | HR 67 | Temp 98.5°F | Ht 68.0 in | Wt 153.0 lb

## 2017-02-05 DIAGNOSIS — Z114 Encounter for screening for human immunodeficiency virus [HIV]: Secondary | ICD-10-CM | POA: Diagnosis not present

## 2017-02-05 DIAGNOSIS — Z0000 Encounter for general adult medical examination without abnormal findings: Secondary | ICD-10-CM

## 2017-02-05 DIAGNOSIS — R7309 Other abnormal glucose: Secondary | ICD-10-CM

## 2017-02-05 DIAGNOSIS — L730 Acne keloid: Secondary | ICD-10-CM

## 2017-02-05 DIAGNOSIS — H7292 Unspecified perforation of tympanic membrane, left ear: Secondary | ICD-10-CM | POA: Diagnosis not present

## 2017-02-05 LAB — POCT GLYCOSYLATED HEMOGLOBIN (HGB A1C): HEMOGLOBIN A1C: 5.6

## 2017-02-05 MED ORDER — MUPIROCIN 2 % EX OINT: 1.0000 "application " | TOPICAL_OINTMENT | Freq: Two times a day (BID) | CUTANEOUS | 0 refills | Status: AC

## 2017-02-05 NOTE — Progress Notes (Signed)
Subjective:  Charles Fletcher is a 36 y.o. male who [facility] today for annual wellness exam   HPI:  Bumps on back of head - Has this chronically. Last was around 5 years ago. [facility] and got better with cream and injections at that time. - Mother thinks may be due to barbershop nicking area because will bleed on occasion - Not itchy, no drainage/odor, redness - Scar on the back of his head has been progressively been getting bigger  Bleeding from L ear  - One episode 2 weeks ago after putting object in his ear. None since.  - Mother states patient has a habit of inserting objects into his ear I.e. Qtips so she stopped buying them - No itching, drainage, redness, fever, acute hearing loss from baseline, dizziness, congestion.    ROS: Per HPI, otherwise all systems reviewed and are negative (via ROS form and verbally reviewed with patient).  PMH:  The following were reviewed and entered/updated in epic: Past Medical History:  Diagnosis Date  . Allergy   . Facial asymmetry    mom and pt decline any workup states they have never been seen by genetics in past.   . hearing loss   . 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 Facial asymmetry 01/31/2011  . Bleeding from left ear 11/29/2010  . Tympanostomy tube check 11/29/2010  . Allergic rhinitis, seasonal 10/24/2010  . Well adult exam 10/24/2010  . 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 HEARING LOSS NOS OR DEAFNESS 09/27/2006   Past Surgical History:  Procedure Laterality Date  . EXTERNAL EAR SURGERY     age 89, plastic surgery  . GASTROSTOMY  age 89  . PALATE SURGERY     36 years of age  . pharynx surgery     age 36, pharyngeal flap  . TYMPANOSTOMY Bilateral 2012    Family History  Problem Relation Age of Onset  . Hyperlipidemia Father   . Hypertension Father   .  Birth defects Brother   . Hearing loss Brother   . Diabetes Maternal Grandmother   . Collagen disease Unknown        mother side of family.  Connective tissue diseases.   . Diabetes Other        fathers side of family with DM.     Medications- reviewed and updated Current Outpatient Prescriptions  Medication Sig Dispense Refill  . fluticasone (FLONASE) 50 MCG/ACT nasal spray Place 1 spray into both nostrils daily.    . mupirocin ointment (BACTROBAN) 2 % Place 1 application into the nose 2 (two) times daily. 22 g 0   No current facility-administered medications for this visit.     Allergies-reviewed and updated No Known Allergies  Social History   Social History  . Marital status: Single    Spouse name: N/A  . Number of children: N/A  . Years of education: N/A   Social History Main Topics  . Smoking status: Never Smoker  . Smokeless tobacco: Never Used  . Alcohol use No  . Drug use: No  . Sexual activity: No   Other Topics Concern  . 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 L TM perforated without drainage or bleeding or purulence or granulation tissue. Ear canal nonerythematous. Hearing intact to voice b/l. No tragus or mastoid or pinna tenderness. MMM, oropharynx nonerythematous.  Neck: supple, no lymphadenopathy CV: RRR with no murmurs appreciated Pulm: NWOB, CTAB with no crackles, wheezes, or rhonchi GI: Normal bowel sounds present. Soft, Nontender, Nondistended. MSK: no edema, cyanosis, or clubbing noted Skin: posterior scalp with 3cm smooth hard raised scar, surrounded by small nonerythematous papules in area of hair growth. Neuro: grossly normal, moves all extremities Psych: Normal affect and  thought content  Results for orders placed or performed in visit on 02/05/17 (from the past 72 hour(s))  HgB A1c     Status: None   Collection Time: 02/05/17  2:24 PM  Result Value Ref Range   Hemoglobin A1C 5.6      Assessment/Plan:  Keloidal folliculitis Per chart review, patient has had issues with folliculitis in the same area in the past with same keloid.  - Bactroban ointment to help prevent infection - Discussed hygiene and gentle exfoliation - Refer to derm for keloid given patient/mother's desire for repeat injection to shrink scar.  Tympanic membrane perforation, left L ear with perforation after insertion of foreign body. No signs of infection or red flags today. Discussed no further use of Qtips in ears. Discussed return precautions.  Well adult exam Doing well. Discussed exercise for healthy lifestyle. Will check routine HIV and POC a1c d/t mother's concerns from fam hx DM. UTD on TDAP. Follow up in 1 year for next annual wellness.   Leland Her, DO PGY-2, [facility] 02/05/2017 2:56 PM

## 2017-02-05 NOTE — Patient Instructions (Addendum)
It was good to meet you today!  For your bumps, - Use bactroban ointment twice a day for 10 days - Keep area clean and dry. We talked about good ways to exfoliate without scratching up the area and creating more inflammation/scars. - Someone should calle you within 2 weeks about dermatology, please let us know if you have not heard back in that time.  For your L ear, - No more sticking things in your ears!  - Things to watch out for: fever, redness, pus, acute hearing loss  Health maintenance - Exercise is good!  - We will check labs today.   Take care,  Dr. Leland HerElsia J Mayah Urquidi, DO [facility]

## 2017-02-05 NOTE — Assessment & Plan Note (Signed)
Per chart review, patient has had issues with folliculitis in the same area in the past with same keloid.  - Bactroban ointment to help prevent infection - Discussed hygiene and gentle exfoliation - Refer to derm for keloid given patient/mother's desire for repeat injection to shrink scar.

## 2017-02-05 NOTE — Assessment & Plan Note (Signed)
L ear with perforation after insertion of foreign body. No signs of infection or red flags today. Discussed no further use of Qtips in ears. Discussed return precautions.

## 2017-02-05 NOTE — Assessment & Plan Note (Signed)
Doing well. Discussed exercise for healthy lifestyle. Will check routine HIV and POC a1c d/t mother's concerns from fam hx DM. UTD on TDAP. Follow up in 1 year for next annual wellness.

## 2017-02-06 LAB — HIV ANTIBODY (ROUTINE TESTING W REFLEX): HIV Screen 4th Generation wRfx: NONREACTIVE

## 2017-02-07 ENCOUNTER — Telehealth: Payer: Self-pay | Admitting: Family Medicine

## 2017-02-07 NOTE — Telephone Encounter (Signed)
Spoke with mother that derm appointment scheduled for 03/14/17 at 10[facility]. Also discussed neg lab results with mother. All questions were answered.

## 2017-03-14 DIAGNOSIS — L730 Acne keloid: Secondary | ICD-10-CM | POA: Diagnosis not present

## 2017-03-16 DIAGNOSIS — L730 Acne keloid: Secondary | ICD-10-CM | POA: Diagnosis not present

## 2020-12-16 DIAGNOSIS — Z20822 Contact with and (suspected) exposure to covid-19: Secondary | ICD-10-CM | POA: Diagnosis not present

## 2020-12-23 ENCOUNTER — Ambulatory Visit (HOSPITAL_COMMUNITY)
Admission: EM | Admit: 2020-12-23 | Discharge: 2020-12-23 | Disposition: A | Discharge status: Home / Self Care | Payer: Medicare Other | Attending: Physician Assistant | Admitting: Physician Assistant

## 2020-12-23 ENCOUNTER — Encounter (HOSPITAL_COMMUNITY): Payer: Self-pay | Admitting: Emergency Medicine

## 2020-12-23 ENCOUNTER — Other Ambulatory Visit: Payer: Self-pay

## 2020-12-23 DIAGNOSIS — H60502 Unspecified acute noninfective otitis externa, left ear: Secondary | ICD-10-CM | POA: Diagnosis not present

## 2020-12-23 DIAGNOSIS — H66001 Acute suppurative otitis media without spontaneous rupture of ear drum, right ear: Secondary | ICD-10-CM | POA: Diagnosis not present

## 2020-12-23 MED ORDER — AMOXICILLIN-POT CLAVULANATE 875-125 MG PO TABS: 1.0000 | ORAL_TABLET | Freq: Two times a day (BID) | ORAL | 0 refills | Status: DC

## 2020-12-23 MED ORDER — OFLOXACIN 0.3 % OT SOLN: 5.0000 [drp] | Freq: Every day | OTIC | 0 refills | Status: DC

## 2020-12-23 NOTE — Discharge Instructions (Signed)
Take Augmentin twice a day to cover for ear infection.  Use ofloxacin drops at night and keep ear facing upward for several minutes to allow drops to get already on the ear canal.  You can use Tylenol and ibuprofen for pain relief.  Please call ENT for further evaluation given recurrent infections.  If anything worsens please return for reevaluation.

## 2020-12-23 NOTE — ED Provider Notes (Signed)
[facility]    CSN: [id number] Arrival date & time: 12/23/20  1505      History   Chief Complaint Chief Complaint  Patient presents with  . Otalgia    HPI Charles Fletcher is a 40 y.o. male.   Patient presents today accompanied by his mother who provides majority of history.  Reports a several day history of left otalgia with associated otorrhea.  Describes otorrhea as purulent and serosanguineous.  Denies associated fever, nausea, vomiting, nasal congestion, cough.  Mother does report patient recently got over COVID-19 and all symptoms have resolved with the exception of occasional cough.  He has not tried any over-the-counter medications for symptom management.  He has a history of chronic ear infections in the left and is previously seen ENT but is not currently followed by specialist.  He is open to establishing with specialist in the near future and requesting information if appropriate.  Denies any recent antibiotic use.  Denies any recent swimming or airplane travel.  He does not use ear buds or earplugs but does report using Q-tips occasionally.  Denies history of diabetes or immunosuppression.     Past Medical History:  Diagnosis Date  . Allergy   . Facial asymmetry    mom and pt decline any workup states they have never been seen by genetics in past.   . hearing loss   . 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 Facial asymmetry 01/31/2011  . Bleeding from left ear 11/29/2010  . Tympanostomy tube check 11/29/2010  . Allergic rhinitis, seasonal 10/24/2010  . Well adult exam 10/24/2010  . 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 HEARING LOSS NOS OR DEAFNESS 09/27/2006    Past Surgical History:  Procedure Laterality Date  . EXTERNAL EAR SURGERY     age 46, plastic surgery  . GASTROSTOMY  age 68  . PALATE SURGERY     40 years of age   . pharynx surgery     age 49, pharyngeal flap  . TYMPANOSTOMY Bilateral 2012       Home Medications    Prior to Admission medications   Medication Sig Start Date End Date Taking? Authorizing Provider  amoxicillin-clavulanate (AUGMENTIN) 875-125 MG tablet Take 1 tablet by mouth every 12 (twelve) hours. 12/23/20  Yes Desmin Daleo K, PA-C  ofloxacin (FLOXIN) 0.3 % OTIC solution Place 5 drops into the left ear daily. 12/23/20  Yes Arhaan Chesnut K, PA-C  fluticasone (FLONASE) 50 MCG/ACT nasal spray Place 1 spray into both nostrils daily.    [provider]  mupirocin ointment (BACTROBAN) 2 % Place 1 application into the nose 2 (two) times daily. 02/05/17   Leland Her, DO    Family History Family History  Problem Relation Age of Onset  . Hyperlipidemia Father   . Hypertension Father   . Birth defects Brother   . Hearing loss Brother   . Diabetes Maternal Grandmother   . Collagen disease Other        mother side of family.  Connective tissue diseases.   . Diabetes Other        fathers side of family with DM.     Social History Social History   Tobacco Use  . Smoking status: Never Smoker  . Smokeless tobacco: Never Used  Vaping Use  . Vaping Use: Never used  Substance  Use Topics  . Alcohol use: No  . Drug use: No     Allergies   Patient has no known allergies.   Review of Systems Review of Systems  Constitutional: Negative for activity change, appetite change, fatigue and fever.  HENT: Positive for ear discharge and ear pain. Negative for congestion, sinus pressure, sneezing and sore throat.   Respiratory: Negative for cough and shortness of breath.   Cardiovascular: Negative for chest pain.  Gastrointestinal: Negative for abdominal pain, diarrhea, nausea and vomiting.  Neurological: Negative for dizziness, light-headedness and headaches.     Physical Exam Triage Vital Signs ED Triage Vitals  Enc Vitals Group     BP 12/23/20 1549 126/86     Pulse Rate  12/23/20 1549 (!) 101     Resp --      Temp 12/23/20 1549 98.5 F (36.9 C)     Temp Source 12/23/20 1549 Oral     SpO2 12/23/20 1549 99 %     Weight --      Height --      Head Circumference --      Peak Flow --      Pain Score 12/23/20 1551 0     Pain Loc --      Pain Edu? --      Excl. in GC? --    No data found.  Updated Vital Signs BP 126/86 (BP Location: Left Arm)   Pulse (!) 101   Temp 98.5 F (36.9 C) (Oral)   SpO2 99%   Visual Acuity Right Eye Distance:   Left Eye Distance:   Bilateral Distance:    Right Eye Near:   Left Eye Near:    Bilateral Near:     Physical Exam Vitals reviewed.  Constitutional:      General: He is awake.     Appearance: Normal appearance. He is normal weight. He is not ill-appearing.     Comments: Very pleasant male appears stated age no acute distress  HENT:     Head: Normocephalic and atraumatic.     Right Ear: Ear canal and external ear normal. Tympanic membrane is erythematous and bulging.     Left Ear: Tympanic membrane, ear canal and external ear normal. Drainage, swelling and tenderness present.     Ears:     Comments: Left ear: Tender palpation of tragus and pain with manipulation of external ear.  Purulent drainage noted at external auditory meatus.  Able to visualize approximately 20% of TM that appears cloudy but with no evidence of bulging.    Nose: Nose normal.     Mouth/Throat:     Pharynx: Uvula midline. No oropharyngeal exudate or posterior oropharyngeal erythema.  Cardiovascular:     Rate and Rhythm: Normal rate and regular rhythm.     Heart sounds: Normal heart sounds. No murmur heard.   Pulmonary:     Effort: Pulmonary effort is normal. No accessory muscle usage or respiratory distress.     Breath sounds: Normal breath sounds. No stridor. No wheezing, rhonchi or rales.     Comments: Clear to auscultation bilaterally Abdominal:     Palpations: Abdomen is soft.     Tenderness: There is no abdominal tenderness.   Lymphadenopathy:     Head:     Right side of head: No submental, submandibular or tonsillar adenopathy.     Left side of head: No submental, submandibular or tonsillar adenopathy.     Cervical: No cervical adenopathy.  Neurological:  Mental Status: He is alert.  Psychiatric:        Behavior: Behavior is cooperative.      UC Treatments / Results  Labs (all labs ordered are listed, but only abnormal results are displayed) Labs Reviewed - No data to display  EKG   Radiology No results found.  Procedures Procedures (including critical care time)  Medications Ordered in UC Medications - No data to display  Initial Impression / Assessment and Plan / UC Course  I have reviewed the triage vital signs and the nursing notes.  Pertinent labs & imaging results that were available during my care of the patient were reviewed by me and considered in my medical decision making (see chart for details).     Patient treated for otitis externa and otitis media based on physical exam findings.  He was prescribed ofloxacin and instructed to keep ear facing upward for several minutes after application of medication to ensure complete penetration throughout ear canal.  He was prescribed Augmentin to cover otitis media.  Recommended alternating Tylenol and ibuprofen for pain relief.  Discouraged him from placing anything in the ears including Q-tips.  Discussed that he should not submerge his head in water until symptoms resolve.  He was given contact information for ENT and encouraged to follow-up given recurrent infections.  Strict return precautions given to which mother expressed understanding.  Final Clinical Impressions(s) / UC Diagnoses   Final diagnoses:  Acute otitis externa of left ear, unspecified type  Non-recurrent acute suppurative otitis media of right ear without spontaneous rupture of tympanic membrane     Discharge Instructions     Take Augmentin twice a day to cover  for ear infection.  Use ofloxacin drops at night and keep ear facing upward for several minutes to allow drops to get already on the ear canal.  You can use Tylenol and ibuprofen for pain relief.  Please call ENT for further evaluation given recurrent infections.  If anything worsens please return for reevaluation.    ED Prescriptions    Medication Sig Dispense Auth. Provider   ofloxacin (FLOXIN) 0.3 % OTIC solution Place 5 drops into the left ear daily. 5 mL Niara Bunker K, PA-C   amoxicillin-clavulanate (AUGMENTIN) 875-125 MG tablet Take 1 tablet by mouth every 12 (twelve) hours. 14 tablet Amay Mijangos, Noberto Retort, PA-C     PDMP not reviewed this encounter.   Jeani Hawking, PA-C 12/23/20 1616

## 2020-12-23 NOTE — ED Triage Notes (Signed)
Patient's mother c/o left ear drainage that's been going on for about one month.  Patient does have a cough, just getting over COVID.  Patient has been taken Tylenol.  Patient is not vaccinated for COVID.

## 2020-12-24 ENCOUNTER — Telehealth (HOSPITAL_COMMUNITY): Payer: Self-pay | Admitting: Emergency Medicine

## 2020-12-24 MED ORDER — AMOXICILLIN-POT CLAVULANATE 400-57 MG/5ML PO SUSR: 800.0000 mg | Freq: Two times a day (BID) | ORAL | 0 refills | Status: AC

## 2022-06-27 DIAGNOSIS — H60501 Unspecified acute noninfective otitis externa, right ear: Secondary | ICD-10-CM | POA: Diagnosis not present

## 2022-06-27 DIAGNOSIS — M549 Dorsalgia, unspecified: Secondary | ICD-10-CM | POA: Diagnosis not present

## 2023-04-03 ENCOUNTER — Emergency Department (HOSPITAL_BASED_OUTPATIENT_CLINIC_OR_DEPARTMENT_OTHER)
Admission: EM | Admit: 2023-04-03 | Discharge: 2023-04-03 | Disposition: A | Discharge status: Home / Self Care | Payer: Medicare HMO | Attending: Emergency Medicine | Admitting: Emergency Medicine

## 2023-04-03 ENCOUNTER — Telehealth (HOSPITAL_BASED_OUTPATIENT_CLINIC_OR_DEPARTMENT_OTHER): Payer: Self-pay | Admitting: Emergency Medicine

## 2023-04-03 ENCOUNTER — Encounter (HOSPITAL_BASED_OUTPATIENT_CLINIC_OR_DEPARTMENT_OTHER): Payer: Self-pay | Admitting: Emergency Medicine

## 2023-04-03 ENCOUNTER — Other Ambulatory Visit: Payer: Self-pay

## 2023-04-03 DIAGNOSIS — H60393 Other infective otitis externa, bilateral: Secondary | ICD-10-CM | POA: Diagnosis not present

## 2023-04-03 DIAGNOSIS — H6504 Acute serous otitis media, recurrent, right ear: Secondary | ICD-10-CM

## 2023-04-03 DIAGNOSIS — H6501 Acute serous otitis media, right ear: Secondary | ICD-10-CM | POA: Insufficient documentation

## 2023-04-03 DIAGNOSIS — H6503 Acute serous otitis media, bilateral: Secondary | ICD-10-CM | POA: Diagnosis not present

## 2023-04-03 DIAGNOSIS — H60312 Diffuse otitis externa, left ear: Secondary | ICD-10-CM | POA: Insufficient documentation

## 2023-04-03 DIAGNOSIS — H9203 Otalgia, bilateral: Secondary | ICD-10-CM | POA: Diagnosis not present

## 2023-04-03 MED ORDER — CIPROFLOXACIN-DEXAMETHASONE 0.3-0.1 % OT SUSP: 4.0000 [drp] | Freq: Two times a day (BID) | OTIC | 0 refills | Status: AC

## 2023-04-03 MED ORDER — AMOXICILLIN 875 MG PO TABS: 875.0000 mg | ORAL_TABLET | Freq: Two times a day (BID) | ORAL | 0 refills | Status: DC

## 2023-04-03 MED ORDER — AMOXICILLIN 400 MG/5ML PO SUSR: 875.0000 mg | Freq: Two times a day (BID) | ORAL | 0 refills | Status: AC

## 2023-04-03 NOTE — ED Triage Notes (Signed)
Pt caox4, ambulatory c/o drainage from both ears x1 week, stating he has PMH recurring ear infections and that this symptom is consistent with when he has gotten them in the past. Pt denies any pain at present. Denies fevers at home, afebrile at present.

## 2023-04-03 NOTE — ED Provider Notes (Signed)
[place] EMERGENCY DEPARTMENT AT [facility] Provider Note   CSN: [id number] Arrival date & time: 04/03/23  4098     History  Chief Complaint  Patient presents with   Otalgia   Patient accompanied by mother at bedside who also helps provide the history Charles Fletcher is a 42 y.o. male with history of tympanostomy tube placement presents with concern for an ear infection.  He states he has a history of recurrent ear infections and still has a tube in his left ear.  He has been having greenish-red ear discharge from the right and left ear for the past 2 days.  Left ear worse than right.  Denies any pain.  States this is similar to previous infections.  Denies any fevers or chills.  Denies any history of immunocompromise.   Otalgia Associated symptoms: ear discharge   Associated symptoms: no fever        Home Medications Prior to Admission medications   Medication Sig Start Date End Date Taking? Authorizing Provider  amoxicillin (AMOXIL) 875 MG tablet Take 1 tablet (875 mg total) by mouth 2 (two) times daily for 5 days. 04/03/23 04/08/23 Yes Arabella Merles, PA-C  ciprofloxacin-dexamethasone (CIPRODEX) OTIC suspension Place 4 drops into both ears 2 (two) times daily for 7 days. 04/03/23 04/10/23 Yes Arabella Merles, PA-C  fluticasone (FLONASE) 50 MCG/ACT nasal spray Place 1 spray into both nostrils daily.    [provider]  mupirocin ointment (BACTROBAN) 2 % Place 1 application into the nose 2 (two) times daily. 02/05/17   Leland Her, DO      Allergies    Other    Review of Systems   Review of Systems  Constitutional:  Negative for fever.  HENT:  Positive for ear discharge. Negative for ear pain.     Physical Exam Updated Vital Signs BP (!) 131/95 (BP Location: Left Arm)   Pulse 70   Temp 98.6 F (37 C) (Oral)   Resp 18   Ht 5\' 8"  (1.727 m)   Wt 75.3 kg   SpO2 98%   BMI 25.24 kg/m  Physical Exam Vitals and nursing note reviewed.  Constitutional:       Appearance: Normal appearance.     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     Ears:     Comments: Right TM erythematous, yellow drainage noted in the ear canal.  External ear canal without any erythema or edema.  No mastoid tenderness on the right side or tenderness to palpation of the tragus  Left TM not able to be visualized, ear canal is edematous.  No mastoid tenderness on the left side or tenderness to palpation of the tragus Cardiovascular:     Rate and Rhythm: Normal rate and regular rhythm.  Pulmonary:     Effort: Pulmonary effort is normal.  Neurological:     General: No focal deficit present.     Mental Status: He is alert.  Psychiatric:        Mood and Affect: Mood normal.        Behavior: Behavior normal.     ED Results / Procedures / Treatments   Labs (all labs ordered are listed, but only abnormal results are displayed) Labs Reviewed - No data to display  EKG None  Radiology No results found.  Procedures Procedures    Medications Ordered in ED Medications - No data  to display  ED Course/ Medical Decision Making/ A&P                                 Medical Decision Making  42 y.o. male with pertinent past medical history of recurrent ear infections, recurrent ear infections, reported tympanostomy tube in the left ear presents to the ED for concern of ear infection, drainage from his left and right ears for past 2 days  Differential diagnosis includes but is not limited to otitis media, otitis externa, perforated TM  ED Course:  Patient with concern for yellowish-red discharge from his ears bilaterally for the past 2 days.  States this is consistent with his prior ear infections.  Denies any pain.  On exam, the right TM is visualized and is erythematous and there is yellowish discharge in the right ear. No perforation  No mastoid tenderness or  tenderness to palpation of the tragus on right or left, normal vital signs, no concern for mastoiditis.  The left TM is unable to be visualized due to the external canal being edematous.  Right TM consistent with otitis media, will treat with course of amoxicillin.  Left ear consistent with otitis externa, will treat with Ciprodex drops.  Encouraged follow-up with ENT within the next 3 months for further evaluation of his recurrent ear infections.    Impression: Left otitis externa Right otitis media  Disposition:  The patient was discharged home with instructions to take course of amoxicillin and Ciprodex drops as prescribed, follow-up with ENT Return precautions given.     Co morbidities that complicate the patient evaluation  History of recurrent ear infections, reported tympanostomy tube in the left ear  Social Determinants of Health:  impaired access to primary care             Final Clinical Impression(s) / ED Diagnoses Final diagnoses:  Recurrent acute serous otitis media of right ear  Acute diffuse otitis externa of left ear    Rx / DC Orders ED Discharge Orders          Ordered    ciprofloxacin-dexamethasone (CIPRODEX) OTIC suspension  2 times daily        04/03/23 0928    amoxicillin (AMOXIL) 875 MG tablet  2 times daily        04/03/23 0928              Arabella Merles, PA-C 04/03/23 1007    Ernie Avena, MD 04/03/23 1800

## 2023-04-03 NOTE — Telephone Encounter (Cosign Needed)
Patient called requesting the amoxicillin be sent in as a liquid as opposed to the pill form sent in earlier.  I have discontinued the amoxicillin in pill form and changed to the liquid form.  This has been sent to the patient's pharmacy.

## 2023-04-03 NOTE — Discharge Instructions (Addendum)
You likely have an infection of both ears.  You have been prescribed an oral antibiotic as well as a eardrop.  Please take both as prescribed.  You have been prescribed amoxicillin. Take this antibiotic 2 times a day for the next 7 days. Take the full course of your antibiotic even if you start feeling better. Antibiotics may cause you to have diarrhea.   You have been prescribed Ciprodex ear drops. Place drops in both ears 2 times a day for the next 7 days. Take the full course of your antibiotic even if you start feeling better.   Please follow-up with ENT regarding your recurrent ear infections.  Their contact information has been listed below.  Return to the ER if you develop fever, significant pain, any other new or concerning symptoms.

## 2023-04-23 ENCOUNTER — Telehealth: Payer: Self-pay

## 2023-04-23 NOTE — Telephone Encounter (Signed)
Transition Care Management Unsuccessful Follow-up Telephone Call  Date of discharge and from where:  [facility]  Attempts:  1st Attempt  Reason for unsuccessful TCM follow-up call:  No answer/busy   Charles Fletcher  [facility], [facility] Guide, Phone: [phone] Website: [email or web address]

## 2023-04-24 ENCOUNTER — Telehealth: Payer: Self-pay

## 2023-04-24 NOTE — Telephone Encounter (Signed)
Transition Care Management Unsuccessful Follow-up Telephone Call  Date of discharge and from where:  [facility]  Attempts:  2nd Attempt  Reason for unsuccessful TCM follow-up call:  No answer/busy   Lenard Forth [place]  [employer], [facility] Guide, Phone: [phone] Website: [email or web address]
